# Patient Record
Sex: Male | Born: 1950 | Race: Black or African American | Hispanic: No | Marital: Married | Smoking: Current some day smoker
Health system: Southern US, Community
[De-identification: ages and names within clinical notes are randomized; demographics above are authoritative.]

## PROBLEM LIST (undated history)

## (undated) DIAGNOSIS — I10 Essential (primary) hypertension: Secondary | ICD-10-CM

## (undated) DIAGNOSIS — K219 Gastro-esophageal reflux disease without esophagitis: Secondary | ICD-10-CM

## (undated) DIAGNOSIS — E785 Hyperlipidemia, unspecified: Secondary | ICD-10-CM

---

## 2008-07-05 ENCOUNTER — Encounter: Payer: Self-pay | Admitting: Emergency Medicine

## 2008-07-05 ENCOUNTER — Inpatient Hospital Stay (HOSPITAL_COMMUNITY): Admission: AD | Admit: 2008-07-05 | Discharge: 2008-07-06 | Payer: Self-pay | Admitting: Cardiology

## 2008-09-18 ENCOUNTER — Emergency Department (HOSPITAL_COMMUNITY): Admission: EM | Admit: 2008-09-18 | Discharge: 2008-09-18 | Payer: Self-pay | Admitting: Emergency Medicine

## 2009-02-13 ENCOUNTER — Encounter: Payer: Self-pay | Admitting: Orthopedic Surgery

## 2009-08-06 ENCOUNTER — Emergency Department (HOSPITAL_COMMUNITY): Admission: EM | Admit: 2009-08-06 | Discharge: 2009-08-06 | Payer: Self-pay | Admitting: Emergency Medicine

## 2010-09-07 LAB — DIFFERENTIAL
Basophils Relative: 0 % (ref 0–1)
Eosinophils Absolute: 0.1 10*3/uL (ref 0.0–0.7)
Monocytes Relative: 10 % (ref 3–12)

## 2010-09-07 LAB — POCT CARDIAC MARKERS
CKMB, poc: <1 ng/mL — ABNORMAL LOW (ref 1.0–8.0)
CKMB, poc: <1 ng/mL — ABNORMAL LOW (ref 1.0–8.0)
Myoglobin, poc: 63 ng/mL (ref 12–200)
Myoglobin, poc: 69.5 ng/mL (ref 12–200)
Myoglobin, poc: 81 ng/mL (ref 12–200)
Troponin i, poc: <0.05 ng/mL (ref 0.00–0.09)
Troponin i, poc: <0.05 ng/mL (ref 0.00–0.09)

## 2010-09-07 LAB — CARDIAC PANEL(CRET KIN+CKTOT+MB+TROPI)
CK, MB: 1 ng/mL (ref 0.3–4.0)
Relative Index: 0.5 (ref 0.0–2.5)
Relative Index: 0.5 (ref 0.0–2.5)
Troponin I: 0.01 ng/mL (ref 0.00–0.06)
Troponin I: <0.01 ng/mL (ref 0.00–0.06)

## 2010-09-07 LAB — COMPREHENSIVE METABOLIC PANEL
AST: 21 U/L (ref 0–37)
Albumin: 3.4 g/dL — ABNORMAL LOW (ref 3.5–5.2)
CO2: 26 mEq/L (ref 19–32)
Chloride: 106 mEq/L (ref 96–112)
Creatinine, Ser: 1.13 mg/dL (ref 0.4–1.5)
GFR calc non Af Amer: >60 mL/min (ref >60)
Total Protein: 6.5 g/dL (ref 6.0–8.3)

## 2010-09-07 LAB — GLUCOSE, CAPILLARY
Glucose-Capillary: 107 mg/dL — ABNORMAL HIGH (ref 70–99)
Glucose-Capillary: 112 mg/dL — ABNORMAL HIGH (ref 70–99)

## 2010-09-07 LAB — CBC
HCT: 38.2 % — ABNORMAL LOW (ref 39.0–52.0)
MCHC: 34.6 g/dL (ref 30.0–36.0)
MCHC: 35.1 g/dL (ref 30.0–36.0)
RBC: 4.17 MIL/uL — ABNORMAL LOW (ref 4.22–5.81)
RDW: 12.8 % (ref 11.5–15.5)

## 2010-09-07 LAB — HEPARIN LEVEL (UNFRACTIONATED)
Heparin Unfractionated: 0.46 IU/mL (ref 0.30–0.70)
Heparin Unfractionated: 0.63 IU/mL (ref 0.30–0.70)
Heparin Unfractionated: 1.18 IU/mL — ABNORMAL HIGH (ref 0.30–0.70)

## 2010-09-07 LAB — PROTIME-INR: Prothrombin Time: 14.8 seconds (ref 11.6–15.2)

## 2010-09-07 LAB — BASIC METABOLIC PANEL
BUN: 18 mg/dL (ref 6–23)
CO2: 27 mEq/L (ref 19–32)
Calcium: 8.7 mg/dL (ref 8.4–10.5)
Chloride: 100 mEq/L (ref 96–112)
GFR calc Af Amer: >60 mL/min (ref >60)
Potassium: 3.3 mEq/L — ABNORMAL LOW (ref 3.5–5.1)

## 2010-09-07 LAB — LIPID PANEL: Cholesterol: 198 mg/dL (ref 0–200)

## 2010-09-07 LAB — D-DIMER, QUANTITATIVE: D-Dimer, Quant: 1.87 ug/mL-FEU — ABNORMAL HIGH (ref 0.00–0.48)

## 2010-09-22 ENCOUNTER — Emergency Department (HOSPITAL_COMMUNITY)
Admission: EM | Admit: 2010-09-22 | Discharge: 2010-09-23 | Disposition: A | Discharge status: Home / Self Care | Payer: Self-pay | Attending: Emergency Medicine | Admitting: Emergency Medicine

## 2010-09-22 DIAGNOSIS — Z79899 Other long term (current) drug therapy: Secondary | ICD-10-CM | POA: Insufficient documentation

## 2010-09-22 DIAGNOSIS — E7800 Pure hypercholesterolemia, unspecified: Secondary | ICD-10-CM | POA: Insufficient documentation

## 2010-09-22 DIAGNOSIS — I10 Essential (primary) hypertension: Secondary | ICD-10-CM | POA: Insufficient documentation

## 2010-09-22 DIAGNOSIS — G5790 Unspecified mononeuropathy of unspecified lower limb: Secondary | ICD-10-CM | POA: Insufficient documentation

## 2010-10-05 NOTE — H&P (Signed)
NAME:  RUBIN, DAIS NO.:  [id number]   MEDICAL RECORD NO.:  [id number]          PATIENT TYPE:  INP   LOCATION:                               FACILITY:  [facility]   PHYSICIAN:  Cristy Hilts. Jacinto Halim, MD       DATE OF BIRTH:  [birth year]   DATE OF ADMISSION:  07/05/2008  DATE OF DISCHARGE:                              HISTORY & PHYSICAL   CHIEF COMPLAINT:  Chest pain.   HISTORY OF PRESENT ILLNESS:  Mr. Patmon is a 60 year old male who was  transferred from [facility] after he presented there with chest pain.  He has no prior history of coronary disease.  He and his wife recently  moved here from [place].  They have no primary care doctor.  He  has no significant past medical history except for history of  hypertension for which he is on medications.  He presented to [facility]  with chest pain worrisome from unstable angina.  Enzymes are negative.  His D-dimer was positive at 3.44 but his CT scan apparently was negative  for PE.  He was put on heparin and transferred to [facility] for further  evaluation.  Currently, he is pain-free.  He describes substernal chest  discomfort that has been off and on for couple of days.  He does have  some radiation to his left arm.  The symptoms did seem to improve with  nitroglycerin.   PAST MEDICAL HISTORY:  Remarkable for hypertension.  He has had no major  surgeries.  There is history of diabetes and his lipid status is  unknown.   MEDICATIONS AT HOME:  1. Lotrel 20/25.  2. Norvasc 5 mg a day.   ALLERGIES:  He has no known drug allergies.   SOCIAL HISTORY:  He is married, he smokes a cigar occasionally but not  cigarettes.   FAMILY HISTORY:  Remarkable for hypertension but negative for coronary  disease or diabetes.  He has 8 siblings.  His father died of colon  cancer.  His mother died of childbirth.   REVIEW OF SYSTEMS:  Essentially unremarkable except for noted above.  Please see H and P from the emergency room for  complete details.   PHYSICAL EXAMINATION:  VITAL SIGNS:  Blood pressure 110/70, pulse 80,  and temp 97.8.  GENERAL:  He is an well-developed, well-nourished African American male  in no acute distress.  HEENT:  Normocephalic.  Extraocular movements are intact.  Sclerae are  nonicteric.  Lids and conjunctivae are within normal limits.  NECK:  Without bruit or JVD.  CHEST:  Clear to auscultation and percussion.  CARDIAC:  Regular rate and rhythm without murmur, rub, or gallop.  Normal S1 and S2.  ABDOMEN:  Nondistended.  No hepatosplenomegaly.  EXTREMITIES:  Without edema.  Distal pulses are intact.  NEUROLOGIC:  Grossly intact.  He is awake, alert, oriented, and  cooperative.  Moves all extremities without obvious deficit.  SKIN:  Warm and dry.   LABORATORY DATA:  CK-MB and troponins are negative x3.  White count 5.8,  hemoglobin 14.4, hematocrit 41.6,  and platelets 201.  Sodium 136,  potassium 3.3, BUN 18, and creatinine 1.1.  D-dimer is 3.4.  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  He did have a CT of his head because he also he had a headache  history but this showed no acute abnormality.  Chest x-ray showed no  acute findings.  EKG showed sinus rhythm without acute changes.   DISPOSITION:  The patient is admitted to telemetry.  He will be set up  for diagnostic catheterization.  Dr. Jacinto Halim would like to recheck his D-  dimer.  We will continue heparin, Lotensin,  HCTZ, Norvasc, and add PPI.  I will also add aspirin.      Abelino Derrick, P.A.      Cristy Hilts. Jacinto Halim, MD  Electronically Signed    LKK/MEDQ  D:  07/05/2008  T:  07/06/2008  Job:  [id number]

## 2010-10-05 NOTE — Discharge Summary (Signed)
NAME:  William Villarreal, SPRINGBORN NO.:  [id number]   MEDICAL RECORD NO.:  [id number]          PATIENT TYPE:  INP   LOCATION:  3703                         FACILITY:  [facility]   PHYSICIAN:  Cristy Hilts. Jacinto Halim, MD       DATE OF BIRTH:  01/18/1951   DATE OF ADMISSION:  07/05/2008  DATE OF DISCHARGE:  07/06/2008                               DISCHARGE SUMMARY   DISCHARGE DIAGNOSES:  1. Chest pain worrisome for unstable angina of new onset.  2. Treated hypertension.  3. New onset of diabetes, diet controlled.  4. Dyslipidemia.   HOSPITAL COURSE:  William Villarreal is a 60 year old male with longstanding  hypertension.  He is admitted to [facility] on July 05, 2008, after an episode of chest pain.  His D-dimer was elevated at [facility], but a CT scan was negative for pulmonary embolism.  The  patient has no other serious medical problems by history.  He was  admitted to telemetry.  D-dimer was repeated here and was 1.87.  The  patient had no further chest pain.  His troponins were negative.  Dr.  Jacinto Halim feels he can be discharged late on July 06, 2008, after he is  ambulated.  We will plan to do an outpatient stress Myoview.  We  adjusted his antihypertensives and added PPI, statin, and aspirin.  He  can follow up with Dr. Jacinto Halim in [place].  Dr. Jacinto Halim does note on  exam, he had no evidence of DVT in his lower extremities.   DISCHARGE MEDICATIONS:  1. Amlodipine 10 mg a day.  2. Pravachol 40 mg a day.  3. Enteric-coated aspirin 325 mg a day.  4. Omeprazole 20 mg a day.  5. Benazepril 20 mg a day.  6. Hydrochlorothiazide 12.5 mg a day.  7. Nitroglycerin 0.4 mg sublingual p.r.n. for severe chest pain.   LABORATORY DATA:  White count 7.2, hemoglobin 14.3, hematocrit 40.8, and  platelets 186.  Hemoglobin A1c is 6.  Sodium 141, potassium 3.3, BUN 14,  creatinine 1.13.  LFTs are normal.  Cardiac markers are negative x3.  TSH is 1.22.  CBGs were obtained and were  consistently elevated 112,  107, 166.  EKG shows sinus rhythm without acute changes.  Chest x-ray  shows no active disease.  He did have a CT of his head done for headache  when he went to [facility].  This showed no acute findings.   DISPOSITION:  The patient is discharged in stable condition and will  follow up with Dr. Jacinto Halim in [place] after his stress test.      Abelino Derrick, P.A.      Cristy Hilts. Jacinto Halim, MD  Electronically Signed    LKK/MEDQ  D:  07/06/2008  T:  07/06/2008  Job:  [id number]   cc:   Cristy Hilts. Jacinto Halim, MD

## 2011-04-01 ENCOUNTER — Encounter: Payer: Self-pay | Admitting: *Deleted

## 2011-04-01 ENCOUNTER — Emergency Department (HOSPITAL_COMMUNITY)
Admission: EM | Admit: 2011-04-01 | Discharge: 2011-04-02 | Disposition: A | Discharge status: Home / Self Care | Payer: Self-pay | Attending: Emergency Medicine | Admitting: Emergency Medicine

## 2011-04-01 DIAGNOSIS — B9789 Other viral agents as the cause of diseases classified elsewhere: Secondary | ICD-10-CM | POA: Insufficient documentation

## 2011-04-01 DIAGNOSIS — I10 Essential (primary) hypertension: Secondary | ICD-10-CM | POA: Insufficient documentation

## 2011-04-01 DIAGNOSIS — B349 Viral infection, unspecified: Secondary | ICD-10-CM

## 2011-04-01 DIAGNOSIS — R5381 Other malaise: Secondary | ICD-10-CM | POA: Insufficient documentation

## 2011-04-01 DIAGNOSIS — Z87891 Personal history of nicotine dependence: Secondary | ICD-10-CM | POA: Insufficient documentation

## 2011-04-01 HISTORY — DX: Essential (primary) hypertension: I10

## 2011-04-01 LAB — CBC
HCT: 38.9 % — ABNORMAL LOW (ref 39.0–52.0)
MCHC: 35.5 g/dL (ref 30.0–36.0)
MCV: 87 fL (ref 78.0–100.0)
Platelets: 186 10*3/uL (ref 150–400)
RDW: 12.4 % (ref 11.5–15.5)
WBC: 7.9 10*3/uL (ref 4.0–10.5)

## 2011-04-01 LAB — BASIC METABOLIC PANEL
BUN: 17 mg/dL (ref 6–23)
Creatinine, Ser: 1.12 mg/dL (ref 0.50–1.35)
GFR calc Af Amer: 81 mL/min — ABNORMAL LOW (ref >90)
GFR calc non Af Amer: 70 mL/min — ABNORMAL LOW (ref >90)
Potassium: 3.5 mEq/L (ref 3.5–5.1)

## 2011-04-01 MED ORDER — HYDROCODONE-ACETAMINOPHEN 5-325 MG PO TABS: ORAL_TABLET | ORAL | Status: AC

## 2011-04-01 MED ORDER — SODIUM CHLORIDE 0.9 % IV BOLUS (SEPSIS)
1000.0000 mL | Freq: Once | INTRAVENOUS | Status: AC
  Administered 2011-04-01: 1000 mL via INTRAVENOUS

## 2011-04-01 MED ORDER — HYDROCODONE-ACETAMINOPHEN 5-325 MG PO TABS
1.0000 | ORAL_TABLET | Freq: Once | ORAL | Status: AC
  Administered 2011-04-01: 1 via ORAL
  Filled 2011-04-01: qty 1

## 2011-04-01 MED ORDER — IBUPROFEN 800 MG PO TABS
800.0000 mg | ORAL_TABLET | Freq: Once | ORAL | Status: AC
  Administered 2011-04-01: 800 mg via ORAL
  Filled 2011-04-01: qty 1

## 2011-04-01 NOTE — ED Notes (Signed)
Pt states around 4pm today he started having body aches, fever, diarrhea, and feels weak. Pt also c/o pain in bilateral hands (when he opens and closes hands)

## 2011-04-01 NOTE — ED Provider Notes (Signed)
History     CSN: [id number] Arrival date & time: 04/01/2011  9:02 PM   First MD Initiated Contact with Patient 04/01/11 2121      Chief Complaint  Patient presents with  . Generalized Body Aches  . Diarrhea  . Fever  . Weakness    (Consider location/radiation/quality/duration/timing/severity/associated sxs/prior treatment) HPI Comments: Pt gives hx of body aches, diarrhea and feeling weak since about 4pm. He is not sure about fevers. No vomiting. No rash. He has not changed diet or meds. He has not been out of the country. His wife has been in [place] and recently returned. Several c0-works at work have had similar symptoms.  Patient is a 60 y.o. male presenting with diarrhea. The history is provided by the patient and the spouse.  Diarrhea The primary symptoms include fatigue, nausea and diarrhea. Primary symptoms do not include fever, abdominal pain, vomiting, melena, hematochezia, dysuria, arthralgias or rash. The illness began today. The onset was gradual. The problem has been gradually worsening.  The illness does not include chills, dysphagia, constipation, back pain or itching. Associated medical issues do not include inflammatory bowel disease, GERD, gallstones, liver disease, alcohol abuse, irritable bowel syndrome or diverticulitis.    Past Medical History  Diagnosis Date  . Hypertension     History reviewed. No pertinent past surgical history.  Family History  Problem Relation Age of Onset  . Cancer Father   . Diabetes Father   . Hypertension Father     History  Substance Use Topics  . Smoking status: Former Games developer  . Smokeless tobacco: Not on file  . Alcohol Use: No      Review of Systems  Constitutional: Positive for fatigue. Negative for fever, chills and activity change.       All ROS Neg except as noted in HPI  HENT: Negative for nosebleeds and neck pain.   Eyes: Negative for photophobia and discharge.  Respiratory: Negative for cough, shortness of  breath and wheezing.   Cardiovascular: Negative for chest pain and palpitations.  Gastrointestinal: Positive for nausea and diarrhea. Negative for dysphagia, vomiting, abdominal pain, constipation, blood in stool, melena and hematochezia.  Genitourinary: Negative for dysuria, frequency and hematuria.  Musculoskeletal: Negative for back pain and arthralgias.  Skin: Negative.  Negative for itching and rash.  Neurological: Negative for dizziness, seizures and speech difficulty.  Psychiatric/Behavioral: Negative for hallucinations and confusion.    Allergies  Review of patient's allergies indicates no known allergies.  Home Medications   Current Outpatient Rx  Name Route Sig Dispense Refill  . AMLODIPINE BESYLATE 10 MG PO TABS Oral Take 10 mg by mouth every morning.      Marland Kitchen BENAZEPRIL HCL 40 MG PO TABS Oral Take 40 mg by mouth every morning.      Marland Kitchen ESOMEPRAZOLE MAGNESIUM 40 MG PO CPDR Oral Take 40 mg by mouth daily before breakfast.      . HYDROCHLOROTHIAZIDE 12.5 MG PO CAPS Oral Take 12.5 mg by mouth every morning.      Marland Kitchen PRAVASTATIN SODIUM 40 MG PO TABS Oral Take 40 mg by mouth at bedtime.        BP 132/85  Pulse 91  Temp(Src) 98.5 F (36.9 C) (Oral)  Resp 20  Ht 5\' 10"  (1.778 m)  Wt 205 lb (92.987 kg)  BMI 29.41 kg/m2  SpO2 98%  Physical Exam  Nursing note and vitals reviewed. Constitutional: He is oriented to person, place, and time. He appears well-developed and well-nourished.  Non-toxic appearance.  HENT:  Head: Normocephalic.  Right Ear: Tympanic membrane and external ear normal.  Left Ear: Tympanic membrane and external ear normal.  Eyes: EOM and lids are normal. Pupils are equal, round, and reactive to light.  Neck: Normal range of motion. Neck supple. Carotid bruit is not present.  Cardiovascular: Normal rate, regular rhythm, normal heart sounds, intact distal pulses and normal pulses.   Pulmonary/Chest: Breath sounds normal. No respiratory distress.  Abdominal:  Soft. Bowel sounds are normal. There is no tenderness. There is no guarding.  Musculoskeletal: Normal range of motion.  Lymphadenopathy:       Head (right side): No submandibular adenopathy present.       Head (left side): No submandibular adenopathy present.    He has no cervical adenopathy.  Neurological: He is alert and oriented to person, place, and time. He has normal strength. No cranial nerve deficit or sensory deficit.  Skin: Skin is warm and dry. No rash noted. He is not diaphoretic.  Psychiatric: He has a normal mood and affect. His speech is normal.    ED Course: Pt feeling much better after IV fluids and pain meds. Vital signs remain stable.  Procedures (including critical care time)   Labs Reviewed  CBC  BASIC METABOLIC PANEL   No results found.   Dx. Viral illness   MDM  I have reviewed nursing notes, vital signs, and all appropriate lab and imaging results for this patient.         Kathie Dike, [place] 04/01/11 2354

## 2011-04-02 NOTE — ED Provider Notes (Signed)
Medical screening examination/treatment/procedure(s) were performed by non-physician practitioner and as supervising physician I was immediately available for consultation/collaboration.   Joya Gaskins, MD 04/02/11 [phone]

## 2012-02-03 ENCOUNTER — Encounter (HOSPITAL_COMMUNITY): Payer: Self-pay | Admitting: Family Medicine

## 2012-02-03 ENCOUNTER — Emergency Department (HOSPITAL_COMMUNITY)
Admission: EM | Admit: 2012-02-03 | Discharge: 2012-02-03 | Disposition: A | Discharge status: Home / Self Care | Payer: Commercial Managed Care - PPO | Attending: Emergency Medicine | Admitting: Emergency Medicine

## 2012-02-03 DIAGNOSIS — Z809 Family history of malignant neoplasm, unspecified: Secondary | ICD-10-CM | POA: Insufficient documentation

## 2012-02-03 DIAGNOSIS — L272 Dermatitis due to ingested food: Secondary | ICD-10-CM | POA: Insufficient documentation

## 2012-02-03 DIAGNOSIS — K219 Gastro-esophageal reflux disease without esophagitis: Secondary | ICD-10-CM | POA: Insufficient documentation

## 2012-02-03 DIAGNOSIS — I10 Essential (primary) hypertension: Secondary | ICD-10-CM | POA: Insufficient documentation

## 2012-02-03 DIAGNOSIS — F17200 Nicotine dependence, unspecified, uncomplicated: Secondary | ICD-10-CM | POA: Insufficient documentation

## 2012-02-03 DIAGNOSIS — Z8249 Family history of ischemic heart disease and other diseases of the circulatory system: Secondary | ICD-10-CM | POA: Insufficient documentation

## 2012-02-03 DIAGNOSIS — T7840XA Allergy, unspecified, initial encounter: Secondary | ICD-10-CM

## 2012-02-03 DIAGNOSIS — Z833 Family history of diabetes mellitus: Secondary | ICD-10-CM | POA: Insufficient documentation

## 2012-02-03 DIAGNOSIS — R220 Localized swelling, mass and lump, head: Secondary | ICD-10-CM | POA: Insufficient documentation

## 2012-02-03 DIAGNOSIS — E785 Hyperlipidemia, unspecified: Secondary | ICD-10-CM | POA: Insufficient documentation

## 2012-02-03 HISTORY — DX: Hyperlipidemia, unspecified: E78.5

## 2012-02-03 HISTORY — DX: Gastro-esophageal reflux disease without esophagitis: K21.9

## 2012-02-03 MED ORDER — PREDNISONE 50 MG PO TABS: 50.0000 mg | ORAL_TABLET | Freq: Every day | ORAL | Status: AC

## 2012-02-03 MED ORDER — LORATADINE 10 MG PO TABS
10.0000 mg | ORAL_TABLET | Freq: Once | ORAL | Status: AC
  Administered 2012-02-03: 10 mg via ORAL
  Filled 2012-02-03: qty 1

## 2012-02-03 MED ORDER — PREDNISONE 20 MG PO TABS
60.0000 mg | ORAL_TABLET | Freq: Once | ORAL | Status: AC
  Administered 2012-02-03: 60 mg via ORAL
  Filled 2012-02-03: qty 3

## 2012-02-03 NOTE — ED Notes (Signed)
Pt reports waking this am w/ lower lip swelling, is on benazapril. (has been taking for years)  Denies any resp distress.

## 2012-02-03 NOTE — ED Notes (Signed)
Pt. C/o left lower lip swelling.  Pt. Reports waking from sleep this morning with the lip swelling.  Pt. Denies swelling prior to this morning.

## 2012-02-03 NOTE — ED Provider Notes (Signed)
History  This chart was scribed for Donnetta Hutching, MD by Shari Heritage. The patient was seen in room APA06/APA06. Patient's care was started at 0748.     CSN: [id number]  Arrival date & time 02/03/12  4098   First MD Initiated Contact with Patient 02/03/12 [phone]      Chief Complaint  Patient presents with  . Oral Swelling  . Allergic Reaction    The history is provided by the patient and the spouse. No language interpreter was used.   William Villarreal is a 61 y.o. male who presents to the Emergency Department complaining of mild lower lip swelling onset 1-2 hours ago. Patient denies SOB, tongue swelling or trouble swallowing. Patient says that when he went to bed, his lip was normal, but he when he woke up this morning, he noticed the swelling prompting him to come to the ED for evaluation. Patient states that he ate tacos last night for dinner and his wife used a new kind of taco sauce. Wife reports that he had a similar swelling reaction a few years ago after eating gumbo. Patient has a history of HTN, acid reflux and hyperlipidemia. He is taking an ACEI, Benazepril, daily.   PCP - Quentin Mulling, NP (Works out of Dr. Pauletta Browns practice in [place], [place])  Past Medical History  Diagnosis Date  . Hypertension   . Acid reflux   . Hyperlipidemia     History reviewed. No pertinent past surgical history.  Family History  Problem Relation Age of Onset  . Cancer Father   . Diabetes Father   . Hypertension Father     History  Substance Use Topics  . Smoking status: Current Some Day Smoker  . Smokeless tobacco: Not on file  . Alcohol Use: Yes     Occ.      Review of Systems A complete 10 system review of systems was obtained and all systems are negative except as noted in the HPI and PMH.   Allergies  Review of patient's allergies indicates no known allergies.  Home Medications   Current Outpatient Rx  Name Route Sig Dispense Refill  . AMLODIPINE BESYLATE 10 MG PO TABS Oral Take  10 mg by mouth every morning.      Marland Kitchen BENAZEPRIL HCL 40 MG PO TABS Oral Take 40 mg by mouth every morning.      Marland Kitchen ESOMEPRAZOLE MAGNESIUM 40 MG PO CPDR Oral Take 40 mg by mouth daily before breakfast.      . HYDROCHLOROTHIAZIDE 12.5 MG PO CAPS Oral Take 12.5 mg by mouth every morning.      Marland Kitchen HYDROCODONE-ACETAMINOPHEN 5-325 MG PO TABS  1 or 2 po  q4h prn aching 20 tablet 0  . PRAVASTATIN SODIUM 40 MG PO TABS Oral Take 40 mg by mouth at bedtime.        BP 132/89  Pulse 67  Temp 98 F (36.7 C) (Oral)  Resp 16  SpO2 100%  Physical Exam  Nursing note and vitals reviewed. Constitutional: He is oriented to person, place, and time. He appears well-developed and well-nourished. No distress.       No distress.  HENT:  Head: Normocephalic and atraumatic.       Mild swelling of lower lip. No tongue or other oral swelling.  Eyes: Conjunctivae normal are normal. Pupils are equal, round, and reactive to light.  Cardiovascular: Normal rate.   Pulmonary/Chest: Effort normal.  Musculoskeletal: Normal range of motion.  Neurological: He is alert and oriented  to person, place, and time.  Skin: Skin is warm and dry.  Psychiatric: He has a normal mood and affect. His behavior is normal.    ED Course  Procedures (including critical care time) DIAGNOSTIC STUDIES: Oxygen Saturation is 100% on room air, normal by my interpretation.    COORDINATION OF CARE: 7:56am- Patient informed of current plan for treatment and evaluation and agrees with plan at this time. Patient presents with mild lower lip swelling, no tongue involvement or distress. Discussed the possibility of patient having a reaction to ACEI medication, but believe reaction results from new taco sauce. Will discharge patient with prescriptions for 4-day course of Prednisone 60 mg and Claritin.   No diagnosis found.    MDM  Suspect mild allergic reaction to Taco sauce.  Possibility of ACE inhibitor induced perioral swelling discussed.         I personally performed the services described in this documentation, which was scribed in my presence. The recorded information has been reviewed and considered.    Donnetta Hutching, MD 02/03/12 [phone]

## 2012-04-20 ENCOUNTER — Encounter (HOSPITAL_COMMUNITY): Payer: Self-pay | Admitting: Emergency Medicine

## 2012-04-20 ENCOUNTER — Emergency Department (HOSPITAL_COMMUNITY)
Admission: EM | Admit: 2012-04-20 | Discharge: 2012-04-21 | Disposition: A | Discharge status: Home / Self Care | Payer: Commercial Managed Care - PPO | Attending: Emergency Medicine | Admitting: Emergency Medicine

## 2012-04-20 DIAGNOSIS — F17200 Nicotine dependence, unspecified, uncomplicated: Secondary | ICD-10-CM | POA: Insufficient documentation

## 2012-04-20 DIAGNOSIS — R112 Nausea with vomiting, unspecified: Secondary | ICD-10-CM | POA: Insufficient documentation

## 2012-04-20 DIAGNOSIS — Z79899 Other long term (current) drug therapy: Secondary | ICD-10-CM | POA: Insufficient documentation

## 2012-04-20 DIAGNOSIS — Z8719 Personal history of other diseases of the digestive system: Secondary | ICD-10-CM | POA: Insufficient documentation

## 2012-04-20 DIAGNOSIS — R197 Diarrhea, unspecified: Secondary | ICD-10-CM | POA: Insufficient documentation

## 2012-04-20 DIAGNOSIS — I10 Essential (primary) hypertension: Secondary | ICD-10-CM | POA: Insufficient documentation

## 2012-04-20 DIAGNOSIS — E785 Hyperlipidemia, unspecified: Secondary | ICD-10-CM | POA: Insufficient documentation

## 2012-04-20 MED ORDER — ONDANSETRON HCL 4 MG/2ML IJ SOLN
4.0000 mg | Freq: Once | INTRAMUSCULAR | Status: AC
  Administered 2012-04-20: 4 mg via INTRAVENOUS

## 2012-04-20 NOTE — ED Notes (Signed)
Family member states patient has had vomiting and diarrhea x 5 hours.  States gave Phenergan suppository, but patient had BM after getting it.  Patient c/o weakness.

## 2012-04-21 ENCOUNTER — Emergency Department (HOSPITAL_COMMUNITY): Payer: Commercial Managed Care - PPO

## 2012-04-21 LAB — BASIC METABOLIC PANEL
GFR calc Af Amer: >90 mL/min (ref >90)
GFR calc non Af Amer: 79 mL/min — ABNORMAL LOW (ref >90)
Potassium: 3.5 mEq/L (ref 3.5–5.1)
Sodium: 136 mEq/L (ref 135–145)

## 2012-04-21 LAB — CBC WITH DIFFERENTIAL/PLATELET
Basophils Absolute: 0 10*3/uL (ref 0.0–0.1)
Basophils Relative: 0 % (ref 0–1)
Lymphocytes Relative: 5 % — ABNORMAL LOW (ref 12–46)
MCHC: 35.2 g/dL (ref 30.0–36.0)
Neutro Abs: 11.3 10*3/uL — ABNORMAL HIGH (ref 1.7–7.7)
Neutrophils Relative %: 87 % — ABNORMAL HIGH (ref 43–77)
RDW: 12.6 % (ref 11.5–15.5)
WBC: 13.1 10*3/uL — ABNORMAL HIGH (ref 4.0–10.5)

## 2012-04-21 LAB — TROPONIN I
Troponin I: <0.3 ng/mL (ref <0.30)
Troponin I: <0.3 ng/mL (ref <0.30)

## 2012-04-21 LAB — LIPASE, BLOOD: Lipase: 29 U/L (ref 11–59)

## 2012-04-21 MED ORDER — PROMETHAZINE HCL 25 MG RE SUPP: 25.0000 mg | Freq: Four times a day (QID) | RECTAL | Status: AC | PRN

## 2012-04-21 MED ORDER — ONDANSETRON HCL 4 MG/2ML IJ SOLN
INTRAMUSCULAR | Status: AC
  Filled 2012-04-21: qty 2

## 2012-04-21 MED ORDER — SODIUM CHLORIDE 0.9 % IV SOLN
1000.0000 mL | Freq: Once | INTRAVENOUS | Status: AC
  Administered 2012-04-21: 1000 mL via INTRAVENOUS

## 2012-04-21 MED ORDER — ONDANSETRON HCL 4 MG/2ML IJ SOLN
4.0000 mg | Freq: Once | INTRAMUSCULAR | Status: AC
  Administered 2012-04-21: 4 mg via INTRAVENOUS
  Filled 2012-04-21: qty 2

## 2012-04-21 MED ORDER — SODIUM CHLORIDE 0.9 % IV SOLN: 1000.0000 mL | INTRAVENOUS | Status: DC

## 2012-04-21 MED ORDER — ONDANSETRON 4 MG PO TBDP: 4.0000 mg | ORAL_TABLET | Freq: Three times a day (TID) | ORAL | Status: AC | PRN

## 2012-04-21 NOTE — ED Provider Notes (Signed)
History     CSN: [id number]  Arrival date & time 04/20/12  2328   First MD Initiated Contact with Patient 04/20/12 2338      Chief Complaint  Patient presents with  . Emesis  . Diarrhea    (Consider location/radiation/quality/duration/timing/severity/associated sxs/prior treatment) Patient is a 61 y.o. male presenting with vomiting and diarrhea. The history is provided by the patient.  Emesis  This is a new problem. The current episode started 3 to 5 hours ago. The problem occurs 5 to 10 times per day. The problem has been gradually worsening. The emesis has an appearance of stomach contents. There has been no fever. Associated symptoms include diarrhea and sweats. Pertinent negatives include no abdominal pain, no arthralgias and no cough. Risk factors include ill contacts.  Diarrhea The primary symptoms include vomiting and diarrhea. Primary symptoms do not include abdominal pain, dysuria or arthralgias.  The illness does not include back pain.    Past Medical History  Diagnosis Date  . Hypertension   . Acid reflux   . Hyperlipidemia     History reviewed. No pertinent past surgical history.  Family History  Problem Relation Age of Onset  . Cancer Father   . Diabetes Father   . Hypertension Father     History  Substance Use Topics  . Smoking status: Current Some Day Smoker  . Smokeless tobacco: Not on file  . Alcohol Use: Yes     Comment: Occ.      Review of Systems  Constitutional: Negative for activity change.       All ROS Neg except as noted in HPI  HENT: Negative for nosebleeds and neck pain.   Eyes: Negative for photophobia and discharge.  Respiratory: Negative for cough, shortness of breath and wheezing.   Cardiovascular: Negative for chest pain and palpitations.  Gastrointestinal: Positive for vomiting and diarrhea. Negative for abdominal pain and blood in stool.  Genitourinary: Negative for dysuria, frequency and hematuria.  Musculoskeletal:  Negative for back pain and arthralgias.  Skin: Negative.   Neurological: Negative for dizziness, seizures and speech difficulty.  Psychiatric/Behavioral: Negative for hallucinations and confusion.    Allergies  Review of patient's allergies indicates no known allergies.  Home Medications   Current Outpatient Rx  Name  Route  Sig  Dispense  Refill  . AMLODIPINE BESYLATE 10 MG PO TABS   Oral   Take 10 mg by mouth every morning.           Marland Kitchen BENAZEPRIL HCL 40 MG PO TABS   Oral   Take 40 mg by mouth every morning.           Marland Kitchen ESOMEPRAZOLE MAGNESIUM 40 MG PO CPDR   Oral   Take 40 mg by mouth daily before breakfast.           . HYDROCHLOROTHIAZIDE 12.5 MG PO CAPS   Oral   Take 12.5 mg by mouth every morning.           Marland Kitchen PRAVASTATIN SODIUM 40 MG PO TABS   Oral   Take 40 mg by mouth at bedtime.           Marland Kitchen HYDROCODONE-ACETAMINOPHEN 5-325 MG PO TABS      1 or 2 po  q4h prn aching   20 tablet   0     BP 117/84  Pulse 85  Temp 97.6 F (36.4 C) (Oral)  Resp 40  Ht 5\' 10"  (1.778 m)  Wt 226 lb (102.513 kg)  BMI 32.43 kg/m2  SpO2 100%  Physical Exam  Nursing note and vitals reviewed. Constitutional: He is oriented to person, place, and time. He appears well-developed and well-nourished.  Non-toxic appearance. He appears ill.  HENT:  Head: Normocephalic.  Right Ear: Tympanic membrane and external ear normal.  Left Ear: Tympanic membrane and external ear normal.  Eyes: EOM and lids are normal. Pupils are equal, round, and reactive to light.  Neck: Normal range of motion. Neck supple. Carotid bruit is not present.  Cardiovascular: Normal rate, regular rhythm, normal heart sounds, intact distal pulses and normal pulses.   Pulmonary/Chest: Tachypnea noted. No respiratory distress. He has no wheezes. He has rhonchi.  Abdominal: Soft. Bowel sounds are decreased. There is no hepatosplenomegaly. There is no tenderness. There is no guarding.  Musculoskeletal: Normal  range of motion.  Lymphadenopathy:       Head (right side): No submandibular adenopathy present.       Head (left side): No submandibular adenopathy present.    He has no cervical adenopathy.  Neurological: He is alert and oriented to person, place, and time. He has normal strength. No cranial nerve deficit or sensory deficit.  Skin: Skin is warm. No cyanosis.  Psychiatric: He has a normal mood and affect. His speech is normal.    ED Course  Procedures (including critical care time)   Labs Reviewed  BASIC METABOLIC PANEL  CBC WITH DIFFERENTIAL  LIPASE, BLOOD   No results found.   No diagnosis found.    MDM  I have reviewed nursing notes, vital signs, and all appropriate lab and imaging results for this patient. Shortly after arriving to the exam room, pt has an episode of vomiting. No hematemesis. Pt c/o feeling weak. IV fluids and zofran given.  Plan for acute abd and labs. Plan explained to the pt and his wife. 1235- Pt returns for xray. States he feels a little better, but still weak. 1:25am Lab reports blood samples for the lipase, troponin and bmet hemolyzed. They are attempting to get a new sample. Pt made aware of the xray results.  Pt's care with be continued by Dr Hyacinth Meeker.  Kathie Dike, [place] 04/26/12 [phone]

## 2012-04-21 NOTE — ED Provider Notes (Signed)
61 year old male who presents with nausea vomiting and diarrhea. He has had a sick contact, his significant other had similar symptoms 48 hours ago and was seen in the emergency department for the same complaint. He states that he has had multiple episodes of watery nonbloody diarrhea, multiple episodes of emesis but has improved significantly after arrival with intravenous Zofran and IV fluids. On my exam the patient is clear heart and lung sounds without murmurs, respiratory abnormalities or abdominal tenderness. He does have increased bowel sounds.  The patient has a syndrome consistent with a gastroenteritis, has normal renal function, slight leukocytosis and acute abdominal series that is not show obstruction or free air. The patient appears stable for discharge with Zofran and rectal Phenergan when necessary.  Medical screening examination/treatment/procedure(s) were conducted as a shared visit with non-physician practitioner(s) and myself.  I personally evaluated the patient during the encounter    Vida Roller, MD 04/21/12 [phone]

## 2012-04-21 NOTE — ED Notes (Signed)
Patient began vomiting and began gurgling.  Patient suctioned and linen and gown changed.

## 2012-04-26 NOTE — ED Provider Notes (Signed)
Medical screening examination/treatment/procedure(s) were conducted as a shared visit with non-physician practitioner(s) and myself.  I personally evaluated the patient during the encounter  Please see my separate respective documentation pertaining to this patient encounter   Vida Roller, MD 04/26/12 2012

## 2013-07-21 DEATH — deceased

## 2014-08-04 ENCOUNTER — Emergency Department (HOSPITAL_COMMUNITY)
Admission: EM | Admit: 2014-08-04 | Discharge: 2014-08-04 | Disposition: A | Discharge status: Home / Self Care | Payer: Self-pay | Attending: Emergency Medicine | Admitting: Emergency Medicine

## 2014-08-04 ENCOUNTER — Encounter (HOSPITAL_COMMUNITY): Payer: Self-pay

## 2014-08-04 ENCOUNTER — Emergency Department (HOSPITAL_COMMUNITY): Payer: Self-pay

## 2014-08-04 DIAGNOSIS — Z79899 Other long term (current) drug therapy: Secondary | ICD-10-CM | POA: Insufficient documentation

## 2014-08-04 DIAGNOSIS — M1712 Unilateral primary osteoarthritis, left knee: Secondary | ICD-10-CM | POA: Insufficient documentation

## 2014-08-04 DIAGNOSIS — Z720 Tobacco use: Secondary | ICD-10-CM | POA: Insufficient documentation

## 2014-08-04 DIAGNOSIS — I10 Essential (primary) hypertension: Secondary | ICD-10-CM | POA: Insufficient documentation

## 2014-08-04 DIAGNOSIS — E785 Hyperlipidemia, unspecified: Secondary | ICD-10-CM | POA: Insufficient documentation

## 2014-08-04 DIAGNOSIS — K219 Gastro-esophageal reflux disease without esophagitis: Secondary | ICD-10-CM | POA: Insufficient documentation

## 2014-08-04 MED ORDER — TRAMADOL HCL 50 MG PO TABS: 50.0000 mg | ORAL_TABLET | Freq: Four times a day (QID) | ORAL | Status: AC | PRN

## 2014-08-04 MED ORDER — TRAMADOL HCL 50 MG PO TABS
50.0000 mg | ORAL_TABLET | Freq: Once | ORAL | Status: AC
  Administered 2014-08-04: 50 mg via ORAL
  Filled 2014-08-04: qty 1

## 2014-08-04 NOTE — ED Provider Notes (Signed)
CSN: [id number]     Arrival date & time 08/04/14  1611 History  This chart was scribed for non-physician practitioner, Burgess AmorJulie Brayli Klingbeil, PA-C, working with Geoffery Lyonsouglas Delo, MD, by Ronney LionSuzanne Le, ED Scribe. This patient was seen in room APFT20/APFT20 and the patient's care was started at 4:47 PM.    Chief Complaint  Patient presents with  . Knee Pain   Patient is a 64 y.o. male presenting with knee pain. The history is provided by the patient.  Knee Pain Location:  Knee Knee location:  L knee Chronicity:  New Dislocation: no   Foreign body present:  No foreign bodies Prior injury to area:  No Relieved by:  Nothing Worsened by:  Nothing tried Ineffective treatments:  None tried Associated symptoms: swelling   Associated symptoms: no fever      HPI Comments: William Villarreal is a 64 y.o. male with a history of hypertension and hyperlipidemia who presents to the Emergency Department complaining of constant, aching left knee pain and swelling with a full sensation behind the knee joint that began 3 days ago when patient was cutting grass. He states that his knee became stiff before becoming sore. Bearing weight exacerbates the pain. He denies any known trauma or injury. He chronically wakes with stiff knee which improves with movement.  He states he has a history of chronic bilateral knee pain and had a steroid injection for his knee about 6 months ago when living in [place]. Patient was also told he has arthritis and would need total bilateral knee arthroplasty eventually. He has not taken any medication for his arthritic pain. He denies missing any doses of his hypertension medication.  He denies any ankle pain. Patient does not have a PCP, since he just moved here from [place]. Patient has NKDA.   Past Medical History  Diagnosis Date  . Hypertension   . Acid reflux   . Hyperlipidemia    No past surgical history on file. Family History  Problem Relation Age of Onset  . Cancer Father   .  Diabetes Father   . Hypertension Father    History  Substance Use Topics  . Smoking status: Current Some Day Smoker  . Smokeless tobacco: Not on file  . Alcohol Use: Yes     Comment: Occ.    Review of Systems  Constitutional: Negative for fever.  Musculoskeletal: Positive for joint swelling and arthralgias. Negative for myalgias.  Neurological: Negative for weakness and numbness.      Allergies  Ace inhibitors  Home Medications   Prior to Admission medications   Medication Sig Start Date End Date Taking? Authorizing Provider  amLODipine (NORVASC) 10 MG tablet Take 10 mg by mouth every morning.      Historical Provider, MD  benazepril (LOTENSIN) 40 MG tablet Take 40 mg by mouth every morning.      Historical Provider, MD  esomeprazole (NEXIUM) 40 MG capsule Take 40 mg by mouth daily before breakfast.      Historical Provider, MD  hydrochlorothiazide (MICROZIDE) 12.5 MG capsule Take 12.5 mg by mouth every morning.      Historical Provider, MD  HYDROcodone-acetaminophen [facility](NORCO) 5-325 MG per tablet 1 or 2 po  q4h prn aching 04/01/11   Ivery QualeHobson Bryant, PA-C  ondansetron (ZOFRAN ODT) 4 MG disintegrating tablet Take 1 tablet (4 mg total) by mouth every 8 (eight) hours as needed for nausea. 04/21/12   Eber HongBrian Miller, MD  pravastatin (PRAVACHOL) 40 MG tablet Take 40 mg by mouth at  bedtime.      Historical Provider, MD  promethazine (PHENERGAN) 25 MG suppository Place 1 suppository (25 mg total) rectally every 6 (six) hours as needed for nausea. 04/21/12   Eber Hong, MD  traMADol (ULTRAM) 50 MG tablet Take 1 tablet (50 mg total) by mouth every 6 (six) hours as needed. 08/04/14   Burgess Amor, PA-C   BP 121/71 mmHg  Pulse 62  Temp(Src) 98.7 F (37.1 C) (Oral)  Resp 18  Ht  (1.778 m)  Wt 239 lb (108.41 kg)  BMI 34.29 kg/m2  SpO2 99% Physical Exam  Constitutional: He appears well-developed and well-nourished.  HENT:  Head: Atraumatic.  Neck: Normal range of motion.   Cardiovascular:  Pulses equal bilaterally  Musculoskeletal: He exhibits tenderness.       Left knee: He exhibits swelling. He exhibits no effusion, no deformity, no erythema, no LCL laxity and no MCL laxity. Tenderness found. Medial joint line tenderness noted.  Bilateral trace ankle edema. Full bilateral dorsalis pedis pulses. No calf tenderness. Negative Homan's sign. No appreciable bakers cyst.  Neurological: He is alert. He has normal strength. He displays normal reflexes. No sensory deficit.  Skin: Skin is warm and dry.  Psychiatric: He has a normal mood and affect.  Nursing note and vitals reviewed.   ED Course  Procedures (including critical care time)  DIAGNOSTIC STUDIES: Oxygen Saturation is 99% on room air, normal by my interpretation.    COORDINATION OF CARE: 4:52 PM - Discussed treatment plan with pt at bedside which includes pain medication, left knee XR, and referral to outpatient orthopedist, and pt agreed to plan.  Imaging Review Dg Knee Complete 4 Views Left  08/04/2014   CLINICAL DATA:  Anterior and medial pain for 3 days  EXAM: LEFT KNEE - COMPLETE 4+ VIEW  COMPARISON:  None.  FINDINGS: Frontal, bilateral oblique, and lateral views were obtained. There is no fracture or dislocation. No appreciable joint effusion. There is moderate narrowing of the patellofemoral joint. There is spurring along the anterior and posterior aspects of the patella. There is milder spurring medially and laterally. No erosive change.  IMPRESSION: Osteoarthritic change, most notably involving the patellofemoral joint. No fracture or joint effusion.   Electronically Signed   By: Bretta Bang III M.D.   On: 08/04/2014 17:24    MDM   Final diagnoses:  Primary osteoarthritis of left knee   Patients labs and/or radiological studies were reviewed and considered during the medical decision making and disposition process.  Results were also discussed with patient.  Pt with history of left  knee osteoarthritis, pain similar to chronic pain, now with full sensation posterior knee despite no obvious bakers cyst on exam.  No exam findings to suggest dvt.  Pt was advised ice after increased use, may add heat after 1st 2 days of ice tx.  Also discussed contrast baths.  Tramadol prescribed. Advised f/u with pcp and/or orthopedist to establish care here, referral given.  I personally performed the services described in this documentation, which was scribed in my presence. The recorded information has been reviewed and is accurate.     Burgess Amor, PA-C 08/05/14 1306  Mancel Bale, MD 08/06/14 [phone]

## 2014-08-04 NOTE — Discharge Instructions (Signed)

## 2014-08-04 NOTE — ED Notes (Signed)
Pt c/o pain behind left knee since Friday.  Denies injury.  Strong pedal pulse present.

## 2014-10-17 ENCOUNTER — Emergency Department (HOSPITAL_COMMUNITY)
Admission: EM | Admit: 2014-10-17 | Discharge: 2014-10-17 | Disposition: A | Discharge status: Home / Self Care | Payer: Self-pay | Attending: Emergency Medicine | Admitting: Emergency Medicine

## 2014-10-17 ENCOUNTER — Encounter (HOSPITAL_COMMUNITY): Payer: Self-pay | Admitting: *Deleted

## 2014-10-17 DIAGNOSIS — Y9289 Other specified places as the place of occurrence of the external cause: Secondary | ICD-10-CM | POA: Insufficient documentation

## 2014-10-17 DIAGNOSIS — Y9389 Activity, other specified: Secondary | ICD-10-CM | POA: Insufficient documentation

## 2014-10-17 DIAGNOSIS — Z79899 Other long term (current) drug therapy: Secondary | ICD-10-CM | POA: Insufficient documentation

## 2014-10-17 DIAGNOSIS — W57XXXA Bitten or stung by nonvenomous insect and other nonvenomous arthropods, initial encounter: Secondary | ICD-10-CM | POA: Insufficient documentation

## 2014-10-17 DIAGNOSIS — S80861A Insect bite (nonvenomous), right lower leg, initial encounter: Secondary | ICD-10-CM | POA: Insufficient documentation

## 2014-10-17 DIAGNOSIS — K219 Gastro-esophageal reflux disease without esophagitis: Secondary | ICD-10-CM | POA: Insufficient documentation

## 2014-10-17 DIAGNOSIS — Z720 Tobacco use: Secondary | ICD-10-CM | POA: Insufficient documentation

## 2014-10-17 DIAGNOSIS — E785 Hyperlipidemia, unspecified: Secondary | ICD-10-CM | POA: Insufficient documentation

## 2014-10-17 DIAGNOSIS — Y998 Other external cause status: Secondary | ICD-10-CM | POA: Insufficient documentation

## 2014-10-17 DIAGNOSIS — S70361A Insect bite (nonvenomous), right thigh, initial encounter: Secondary | ICD-10-CM | POA: Insufficient documentation

## 2014-10-17 DIAGNOSIS — L089 Local infection of the skin and subcutaneous tissue, unspecified: Secondary | ICD-10-CM | POA: Insufficient documentation

## 2014-10-17 DIAGNOSIS — I10 Essential (primary) hypertension: Secondary | ICD-10-CM | POA: Insufficient documentation

## 2014-10-17 MED ORDER — SULFAMETHOXAZOLE-TRIMETHOPRIM 800-160 MG PO TABS: 1.0000 | ORAL_TABLET | Freq: Two times a day (BID) | ORAL | Status: AC

## 2014-10-17 MED ORDER — SULFAMETHOXAZOLE-TRIMETHOPRIM 800-160 MG PO TABS
1.0000 | ORAL_TABLET | Freq: Once | ORAL | Status: AC
  Administered 2014-10-17: 1 via ORAL
  Filled 2014-10-17: qty 1

## 2014-10-17 NOTE — ED Provider Notes (Signed)
CSN: [id number]     Arrival date & time 10/17/14  2046 History   First MD Initiated Contact with Patient 10/17/14 2058     No chief complaint on file.    (Consider location/radiation/quality/duration/timing/severity/associated sxs/prior Treatment) HPI William Villarreal is a 64 y.o. male who presents to the ED with insect bites to the right calf and thigh. The bites occurred 2 days ago. Today the bite to the thigh has a pustule and the one to the calf has redness and warmth.   Past Medical History  Diagnosis Date  . Hypertension   . Acid reflux   . Hyperlipidemia    History reviewed. No pertinent past surgical history. Family History  Problem Relation Age of Onset  . Cancer Father   . Diabetes Father   . Hypertension Father    History  Substance Use Topics  . Smoking status: Current Some Day Smoker    Types: Cigarettes  . Smokeless tobacco: Not on file  . Alcohol Use: Yes     Comment: Occ.    Review of Systems Negative except as stated in HPI   Allergies  Ace inhibitors  Home Medications   Prior to Admission medications   Medication Sig Start Date End Date Taking? Authorizing Provider  amLODipine (NORVASC) 10 MG tablet Take 10 mg by mouth every morning.      Historical Provider, MD  benazepril (LOTENSIN) 40 MG tablet Take 40 mg by mouth every morning.      Historical Provider, MD  esomeprazole (NEXIUM) 40 MG capsule Take 40 mg by mouth daily before breakfast.      Historical Provider, MD  hydrochlorothiazide (MICROZIDE) 12.5 MG capsule Take 12.5 mg by mouth every morning.      Historical Provider, MD  HYDROcodone-acetaminophen [facility](NORCO) 5-325 MG per tablet 1 or 2 po  q4h prn aching 04/01/11   Ivery QualeHobson Bryant, PA-C  ondansetron (ZOFRAN ODT) 4 MG disintegrating tablet Take 1 tablet (4 mg total) by mouth every 8 (eight) hours as needed for nausea. 04/21/12   Eber HongBrian Miller, MD  pravastatin (PRAVACHOL) 40 MG tablet Take 40 mg by mouth at bedtime.      Historical Provider, MD    promethazine (PHENERGAN) 25 MG suppository Place 1 suppository (25 mg total) rectally every 6 (six) hours as needed for nausea. 04/21/12   Eber HongBrian Miller, MD  sulfamethoxazole-trimethoprim (BACTRIM DS,SEPTRA DS) 800-160 MG per tablet Take 1 tablet by mouth 2 (two) times daily. 10/17/14 10/24/14  Jacquetta Polhamus Orlene OchM Celso Granja, NP  traMADol (ULTRAM) 50 MG tablet Take 1 tablet (50 mg total) by mouth every 6 (six) hours as needed. 08/04/14   Burgess AmorJulie Idol, PA-C   BP 132/86 mmHg  Pulse 62  Temp(Src) 99 F (37.2 C) (Oral)  Ht 5\' 10"  (1.778 m)  Wt 220 lb (99.791 kg)  BMI 31.57 kg/m2  SpO2 97% Physical Exam  Constitutional: He is oriented to person, place, and time. He appears well-developed and well-nourished.  HENT:  Head: Normocephalic.  Eyes: Conjunctivae and EOM are normal.  Neck: Neck supple.  Cardiovascular: Normal rate.   Pulmonary/Chest: Effort normal.  Musculoskeletal: Normal range of motion.       Legs: There is a pustular area noted to the right posterior thigh. There is a tender, erythematous, raised area to the posterior right calf with increased warmth. There is a lesion central that appears as an insect bite.    Neurological: He is alert and oriented to person, place, and time. No cranial nerve deficit.  Skin:  Skin is warm and dry.  Psychiatric: He has a normal mood and affect. His behavior is normal.  Nursing note and vitals reviewed.   ED Course  Procedures   MDM  64 y.o. male with infected insect bites to the posterior right leg. Stable for d/c without red streaking, fever and does not appear toxic. Will treat for infection and he will return as needed for worsening symptoms.  Final diagnoses:  Infected insect bite     [facility], NP 10/17/14 2126  Donnetta Hutching, MD 10/17/14 2322

## 2014-10-17 NOTE — Discharge Instructions (Signed)
We are treating you with antibiotic for the infected insect bite. You may take benadry for itching as needed and tylenol if you have pain.

## 2014-10-17 NOTE — ED Notes (Signed)
Insect bite to rt post calf.x 2 days

## 2014-10-17 NOTE — ED Notes (Signed)
Pt made aware to return if symptoms worsen or if any life threatening symptoms occur.   

## 2015-10-05 IMAGING — DX DG KNEE COMPLETE 4+V*L*
4 series · 4 of 4 positions shown · non-contrast
Comparison: None.

CLINICAL DATA: Anterior and medial pain for 3 days

EXAM:
LEFT KNEE - COMPLETE 4+ VIEW

[knee ap]
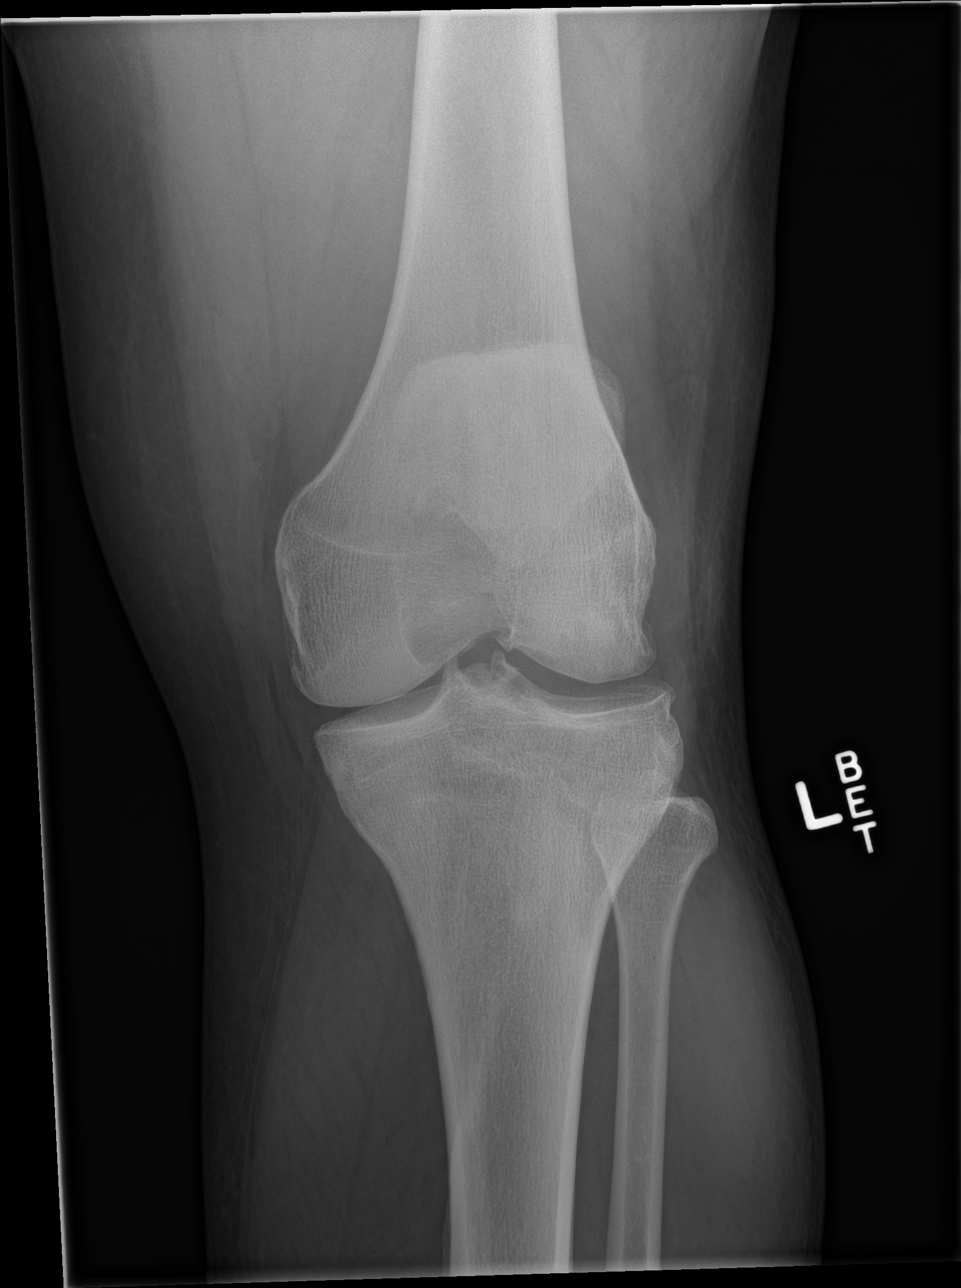

[knee obl (1 of 2)]
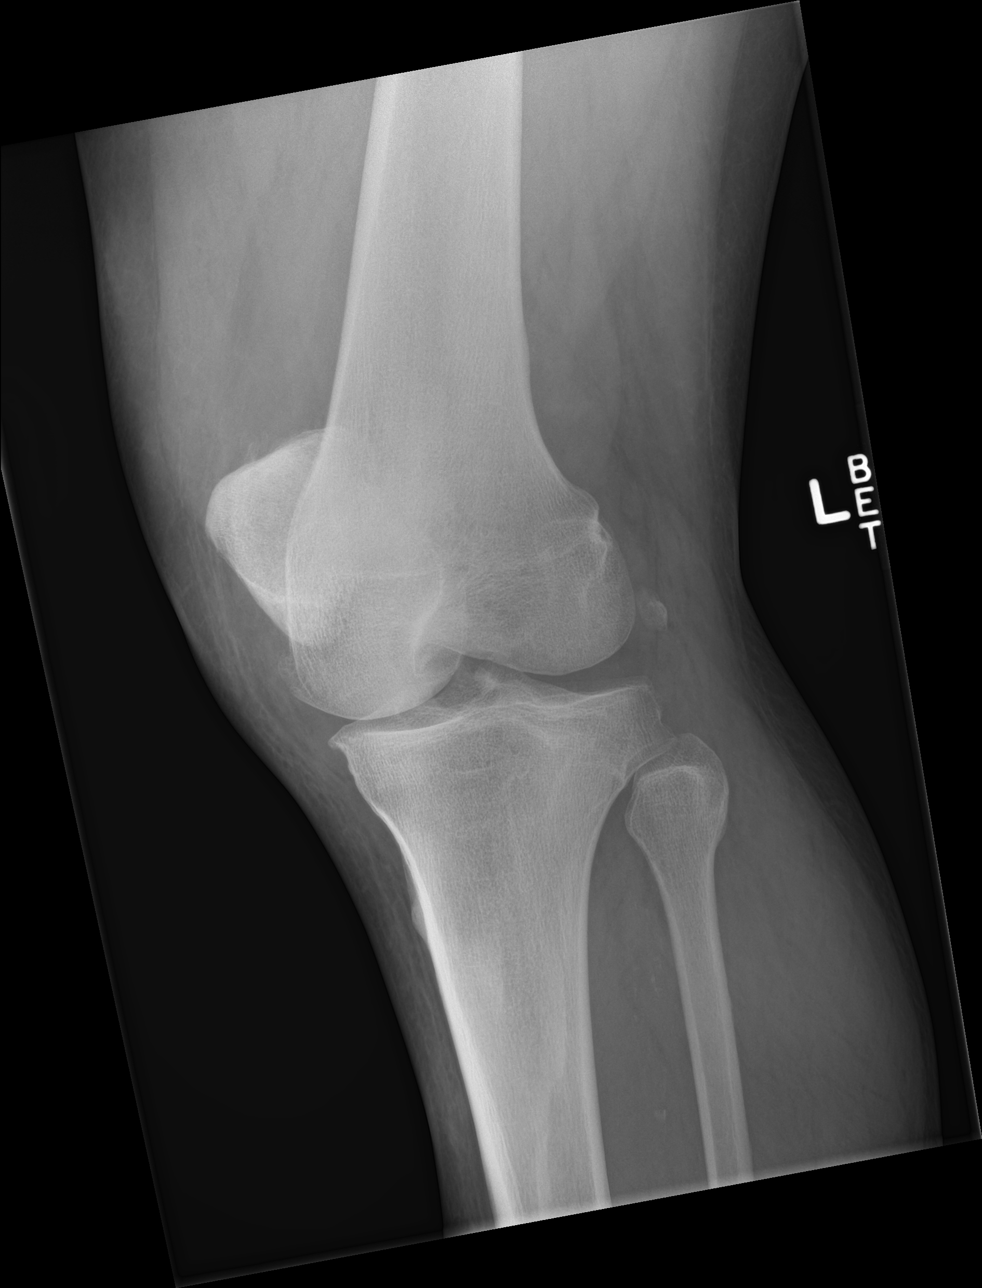

[knee obl (2 of 2)]
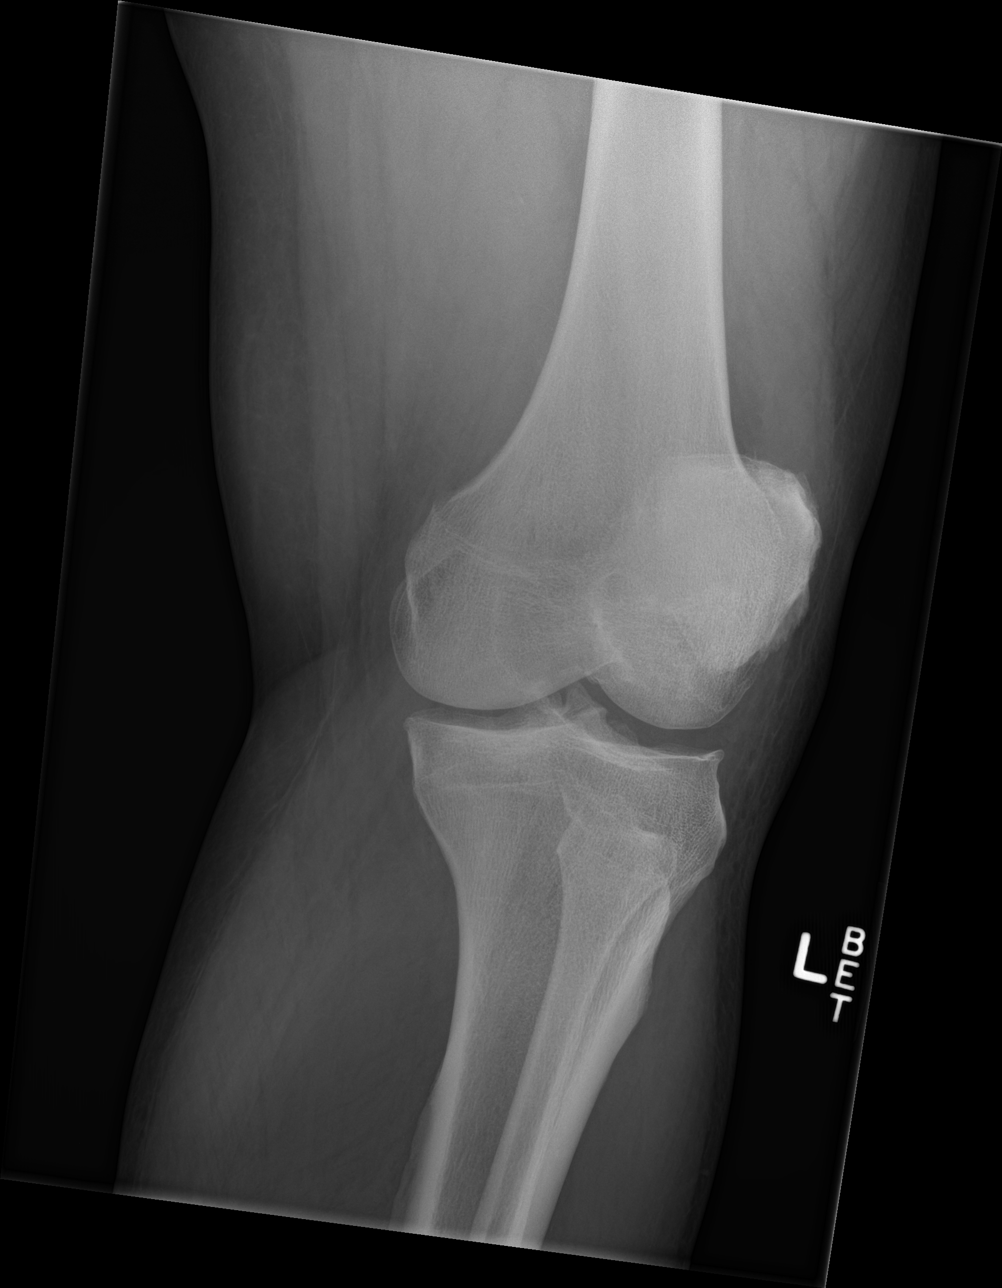

[knee lat]
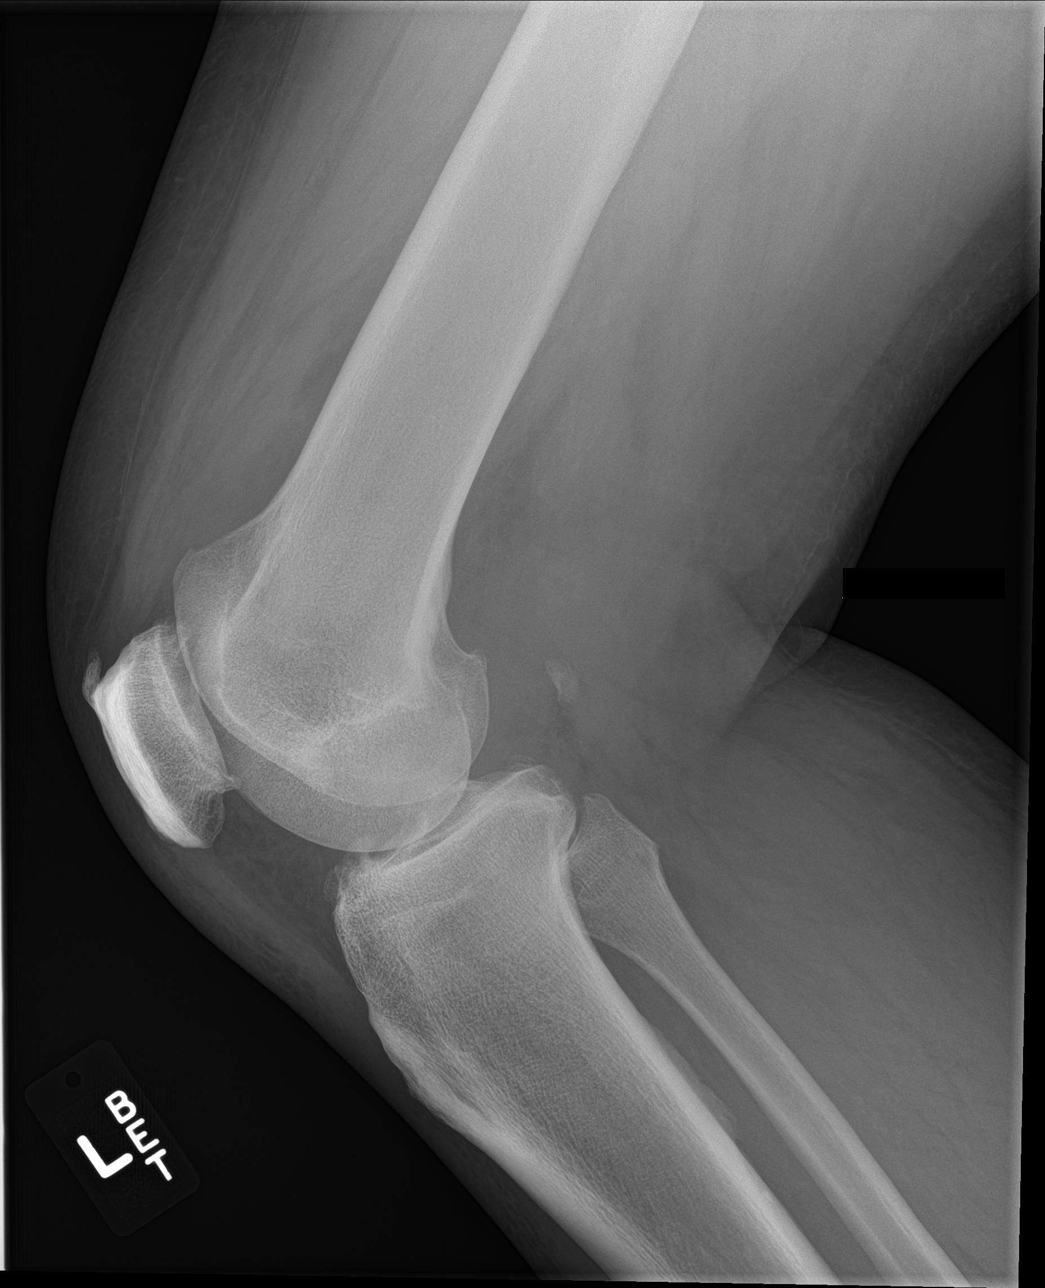

[4 of 4 positions shown; findings below may reference images not displayed]

FINDINGS: Frontal, bilateral oblique, and lateral views were obtained. There
is no fracture or dislocation. No appreciable joint effusion. There
is moderate narrowing of the patellofemoral joint. There is spurring
along the anterior and posterior aspects of the patella. There is
milder spurring medially and laterally. No erosive change.
IMPRESSION: Osteoarthritic change, most notably involving the patellofemoral
joint. No fracture or joint effusion.
